# Patient Record
Sex: Male | Born: 1980 | Race: Black or African American | Hispanic: No | Marital: Married | Smoking: Current every day smoker
Health system: Southern US, Community
[De-identification: ages and names within clinical notes are randomized; demographics above are authoritative.]

---

## 2015-04-18 ENCOUNTER — Emergency Department
Admission: EM | Admit: 2015-04-18 | Discharge: 2015-04-18 | Disposition: A | Discharge status: Home / Self Care | Payer: Self-pay | Attending: Student | Admitting: Student

## 2015-04-18 ENCOUNTER — Emergency Department: Payer: Self-pay

## 2015-04-18 ENCOUNTER — Encounter: Payer: Self-pay | Admitting: Emergency Medicine

## 2015-04-18 DIAGNOSIS — Y9389 Activity, other specified: Secondary | ICD-10-CM | POA: Insufficient documentation

## 2015-04-18 DIAGNOSIS — F10129 Alcohol abuse with intoxication, unspecified: Secondary | ICD-10-CM | POA: Insufficient documentation

## 2015-04-18 DIAGNOSIS — S40811A Abrasion of right upper arm, initial encounter: Secondary | ICD-10-CM | POA: Insufficient documentation

## 2015-04-18 DIAGNOSIS — Y998 Other external cause status: Secondary | ICD-10-CM | POA: Insufficient documentation

## 2015-04-18 DIAGNOSIS — S0993XA Unspecified injury of face, initial encounter: Secondary | ICD-10-CM | POA: Insufficient documentation

## 2015-04-18 DIAGNOSIS — Y92410 Unspecified street and highway as the place of occurrence of the external cause: Secondary | ICD-10-CM | POA: Insufficient documentation

## 2015-04-18 DIAGNOSIS — Z720 Tobacco use: Secondary | ICD-10-CM | POA: Insufficient documentation

## 2015-04-18 DIAGNOSIS — S80212A Abrasion, left knee, initial encounter: Secondary | ICD-10-CM | POA: Insufficient documentation

## 2015-04-18 LAB — CBC WITH DIFFERENTIAL/PLATELET
BASOS PCT: 1 %
Basophils Absolute: 0.1 10*3/uL (ref 0–0.1)
Eosinophils Absolute: 0.2 10*3/uL (ref 0–0.7)
Eosinophils Relative: 3 %
HCT: 41.8 % (ref 40.0–52.0)
Hemoglobin: 13.2 g/dL (ref 13.0–18.0)
LYMPHS ABS: 1.9 10*3/uL (ref 1.0–3.6)
LYMPHS PCT: 29 %
MCH: 24 pg — AB (ref 26.0–34.0)
MCHC: 31.6 g/dL — ABNORMAL LOW (ref 32.0–36.0)
MCV: 75.9 fL — ABNORMAL LOW (ref 80.0–100.0)
Monocytes Absolute: 0.4 10*3/uL (ref 0.2–1.0)
Monocytes Relative: 6 %
Neutro Abs: 3.8 10*3/uL (ref 1.4–6.5)
Neutrophils Relative %: 61 %
Platelets: 189 10*3/uL (ref 150–440)
RBC: 5.51 MIL/uL (ref 4.40–5.90)
RDW: 15.5 % — ABNORMAL HIGH (ref 11.5–14.5)
WBC: 6.3 10*3/uL (ref 3.8–10.6)

## 2015-04-18 LAB — COMPREHENSIVE METABOLIC PANEL
ALBUMIN: 4.1 g/dL (ref 3.5–5.0)
ALT: 63 U/L (ref 17–63)
AST: 46 U/L — AB (ref 15–41)
Alkaline Phosphatase: 66 U/L (ref 38–126)
Anion gap: 8 (ref 5–15)
BUN: 6 mg/dL (ref 6–20)
CO2: 27 mmol/L (ref 22–32)
Calcium: 9.4 mg/dL (ref 8.9–10.3)
Chloride: 104 mmol/L (ref 101–111)
Creatinine, Ser: 1.07 mg/dL (ref 0.61–1.24)
GFR calc Af Amer: >60 mL/min (ref >60)
GFR calc non Af Amer: >60 mL/min (ref >60)
GLUCOSE: 99 mg/dL (ref 65–99)
Potassium: 3.6 mmol/L (ref 3.5–5.1)
Sodium: 139 mmol/L (ref 135–145)
Total Bilirubin: 0.3 mg/dL (ref 0.3–1.2)
Total Protein: 7.2 g/dL (ref 6.5–8.1)

## 2015-04-18 LAB — URINE DRUG SCREEN, QUALITATIVE (ARMC ONLY)
Amphetamines, Ur Screen: NOT DETECTED
BARBITURATES, UR SCREEN: NOT DETECTED
Benzodiazepine, Ur Scrn: POSITIVE — AB
CANNABINOID 50 NG, UR ~~LOC~~: POSITIVE — AB
COCAINE METABOLITE, UR ~~LOC~~: NOT DETECTED
MDMA (Ecstasy)Ur Screen: NOT DETECTED
METHADONE SCREEN, URINE: NOT DETECTED
Opiate, Ur Screen: NOT DETECTED
Phencyclidine (PCP) Ur S: NOT DETECTED
Tricyclic, Ur Screen: NOT DETECTED

## 2015-04-18 LAB — SALICYLATE LEVEL: Salicylate Lvl: <4 mg/dL (ref 2.8–30.0)

## 2015-04-18 LAB — ETHANOL: ALCOHOL ETHYL (B): 6 mg/dL — AB (ref <5)

## 2015-04-18 LAB — ACETAMINOPHEN LEVEL: Acetaminophen (Tylenol), Serum: <10 ug/mL — ABNORMAL LOW (ref 10–30)
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
## 2015-04-18 NOTE — ED Notes (Signed)
Pt refused his discharge paper.

## 2015-04-18 NOTE — ED Provider Notes (Signed)
  Physical Exam  BP 146/92 mmHg  Pulse 92  Temp(Src) 98.9 F (37.2 C) (Oral)  Resp 16  SpO2 100%  Physical Exam Patient name and waning throughout the department without slurred speech and with a normal gait. Patient saying he is wanting to leave. I told the patient we would be happy to give him his discharge paperwork. He says that he does not want his discharge paperwork and is leaving. Was not clinically intoxicated at this time.  Alcohol level at 6. Did not receive discharge paperwork. ED Course  Procedures  signout from Dr. Inocencio Homes follow up with labs and disposition accordingly when sober.     Myrna Blazer, MD 04/18/15 [phone]
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
